# Patient Record
Sex: Male | Born: 1990 | Race: Black or African American | Hispanic: No | Marital: Single | Smoking: Current every day smoker
Health system: Southern US, Community
[De-identification: ages and names within clinical notes are randomized; demographics above are authoritative.]

---
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
## 2011-04-21 NOTE — Op Note (Signed)
NAMEMAXMILLIAN, Michael Sanford                 ACCOUNT NO.:  [id number]   MEDICAL RECORD NO.:  [id number]          PATIENT TYPE:  AMB   LOCATION:  DSC                          FACILITY:  [facility]   PHYSICIAN:  [name]. Dalldorf, M.D.DATE OF BIRTH:  08/02/1991   DATE OF PROCEDURE:  11/01/2007  DATE OF DISCHARGE:                               OPERATIVE REPORT   PREOPERATIVE DIAGNOSIS:  Left hip retained hardware.   POSTOPERATIVE DIAGNOSIS:  Left hip retained hardware.   PROCEDURE:  Left hip removal of hardware.   ANESTHESIA:  General.   ATTENDING SURGEON:  [name]. Jerl Santos, M.D.   ASSISTANT:  Lindwood Qua, P.A.   INDICATIONS FOR PROCEDURE:  The patient is a 20 year old boy less than a  year from SCFE pinning.  His growth plate has gone on to close and his  pain has been eliminated.  He is offered hardware removal of this point.  Informed operative consent was obtained after discussion of possible  complications of reaction to anesthesia and infection.   SUMMARY OF FINDINGS AND PROCEDURE:  Under general anesthesia through a  slightly extended incision, a single Ace cannulated pediatric hip screw  was removed.  I used fluoroscopy throughout the case to make appropriate  intraoperative decisions.  I read all these views myself.   DESCRIPTION OF PROCEDURE:  The patient was taken to an operating suite  where general anesthetic was applied without difficulty.  He is  positioned in the lateral decubitus position on a beanbag and axillary  roll was placed.  He was prepped and draped in normal sterile fashion.  After administration of preop IV Kefzol, his old incision was made with  dissection down to the hip screw.  Unfortunately, there was some bony  overgrowth and we could not remove this percutaneously.  As a result, I  had to extend the incision proximally about an inch and dissected down  in an open fashion to expose the screw.  I used the chisel to remove  some surrounding bone and placed  the appropriate T-handle guide over the  screw and removed it without incident.  Fluoroscopy was used to firm  adequate removal of the hardware and also to confirm that his growth  plate had closed.  The wound was irrigated followed by reapproximation  of deep tissues with Vicryl and skin with nylon.  Adaptic was applied  followed by dry gauze and tape.  Estimated blood loss and intraoperative  fluids can be obtained from anesthesia records.   DISPOSITION:  The patient was extubated in the operating room and taken  to recovery room in stable addition.  He was to go home the same day and  follow up in the office less than a week.  We gave him his screws  sterilized at the end of the case.  I will contact him by phone tonight.      [name] Jerl Santos, M.D.  Electronically Signed     PGD/MEDQ  D:  11/01/2007  T:  11/01/2007  Job:  [id number]

## 2011-05-06 ENCOUNTER — Emergency Department (HOSPITAL_COMMUNITY)
Admission: EM | Admit: 2011-05-06 | Discharge: 2011-05-06 | Disposition: A | Discharge status: Home / Self Care | Payer: Medicaid Other | Attending: Emergency Medicine | Admitting: Emergency Medicine

## 2011-05-06 DIAGNOSIS — Z202 Contact with and (suspected) exposure to infections with a predominantly sexual mode of transmission: Secondary | ICD-10-CM | POA: Insufficient documentation

## 2011-05-06 DIAGNOSIS — N342 Other urethritis: Secondary | ICD-10-CM | POA: Insufficient documentation

## 2011-05-06 LAB — URINALYSIS, ROUTINE W REFLEX MICROSCOPIC
Bilirubin Urine: NEGATIVE
Glucose, UA: NEGATIVE mg/dL
Ketones, ur: NEGATIVE mg/dL
Protein, ur: NEGATIVE mg/dL
Specific Gravity, Urine: 1.023 (ref 1.005–1.030)
pH: 5 (ref 5.0–8.0)

## 2011-05-06 LAB — URINE MICROSCOPIC-ADD ON

## 2011-05-09 ENCOUNTER — Emergency Department (HOSPITAL_COMMUNITY)
Admission: EM | Admit: 2011-05-09 | Discharge: 2011-05-09 | Discharge status: Against Medical Advice | Payer: Medicaid Other | Attending: Emergency Medicine | Admitting: Emergency Medicine

## 2011-05-09 ENCOUNTER — Emergency Department (HOSPITAL_COMMUNITY): Payer: Medicaid Other

## 2011-05-09 DIAGNOSIS — S20219A Contusion of unspecified front wall of thorax, initial encounter: Secondary | ICD-10-CM | POA: Insufficient documentation

## 2011-05-09 LAB — GC/CHLAMYDIA PROBE AMP, GENITAL
Chlamydia, DNA Probe: POSITIVE — AB
GC Probe Amp, Genital: NEGATIVE

## 2011-08-22 ENCOUNTER — Emergency Department (HOSPITAL_COMMUNITY)
Admission: EM | Admit: 2011-08-22 | Discharge: 2011-08-22 | Disposition: A | Discharge status: Home / Self Care | Payer: Medicaid Other | Attending: Emergency Medicine | Admitting: Emergency Medicine

## 2011-08-22 ENCOUNTER — Emergency Department (HOSPITAL_COMMUNITY): Payer: Medicaid Other

## 2011-08-22 DIAGNOSIS — M25473 Effusion, unspecified ankle: Secondary | ICD-10-CM | POA: Insufficient documentation

## 2011-08-22 DIAGNOSIS — R300 Dysuria: Secondary | ICD-10-CM | POA: Insufficient documentation

## 2011-08-22 DIAGNOSIS — M25476 Effusion, unspecified foot: Secondary | ICD-10-CM | POA: Insufficient documentation

## 2011-08-22 DIAGNOSIS — X500XXA Overexertion from strenuous movement or load, initial encounter: Secondary | ICD-10-CM | POA: Insufficient documentation

## 2011-08-22 DIAGNOSIS — S93409A Sprain of unspecified ligament of unspecified ankle, initial encounter: Secondary | ICD-10-CM | POA: Insufficient documentation

## 2011-08-22 DIAGNOSIS — Y9367 Activity, basketball: Secondary | ICD-10-CM | POA: Insufficient documentation

## 2011-08-22 DIAGNOSIS — M25579 Pain in unspecified ankle and joints of unspecified foot: Secondary | ICD-10-CM | POA: Insufficient documentation

## 2011-08-25 LAB — GC/CHLAMYDIA PROBE AMP, GENITAL: GC Probe Amp, Genital: NEGATIVE

## 2011-09-15 LAB — POCT HEMOGLOBIN-HEMACUE: Operator id: 116011

## 2013-03-05 ENCOUNTER — Emergency Department (HOSPITAL_COMMUNITY)
Admission: EM | Admit: 2013-03-05 | Discharge: 2013-03-05 | Discharge status: Absent without leave | Payer: Self-pay | Source: Home / Self Care

## 2014-12-13 ENCOUNTER — Emergency Department (HOSPITAL_COMMUNITY)
Admission: EM | Admit: 2014-12-13 | Discharge: 2014-12-13 | Disposition: A | Discharge status: Home / Self Care | Payer: Self-pay | Attending: Emergency Medicine | Admitting: Emergency Medicine

## 2014-12-13 ENCOUNTER — Encounter (HOSPITAL_COMMUNITY): Payer: Self-pay | Admitting: Emergency Medicine

## 2014-12-13 DIAGNOSIS — W109XXA Fall (on) (from) unspecified stairs and steps, initial encounter: Secondary | ICD-10-CM | POA: Insufficient documentation

## 2014-12-13 DIAGNOSIS — Y9389 Activity, other specified: Secondary | ICD-10-CM | POA: Insufficient documentation

## 2014-12-13 DIAGNOSIS — Y998 Other external cause status: Secondary | ICD-10-CM | POA: Insufficient documentation

## 2014-12-13 DIAGNOSIS — Z720 Tobacco use: Secondary | ICD-10-CM | POA: Insufficient documentation

## 2014-12-13 DIAGNOSIS — Y9289 Other specified places as the place of occurrence of the external cause: Secondary | ICD-10-CM | POA: Insufficient documentation

## 2014-12-13 DIAGNOSIS — S8992XA Unspecified injury of left lower leg, initial encounter: Secondary | ICD-10-CM | POA: Insufficient documentation

## 2014-12-13 MED ORDER — IBUPROFEN 800 MG PO TABS: 800.0000 mg | ORAL_TABLET | Freq: Three times a day (TID) | ORAL | Status: AC | PRN

## 2014-12-13 NOTE — ED Notes (Signed)
Pt comfortable with discharge and follow up instructions. Pt declines wheelchair, escorted to waiting area by this RN. Prescriptions x1. 

## 2014-12-13 NOTE — ED Notes (Signed)
Pt ambulated to room without difficulty

## 2014-12-13 NOTE — Discharge Instructions (Signed)
Read the information below.  Use the prescribed medication as directed.  Please discuss all new medications with your pharmacist.  You may return to the Emergency Department at any time for worsening condition or any new symptoms that concern you.  If you develop uncontrolled pain, weakness or numbness of the extremity, severe discoloration of the skin, or you are unable to walk or move your knee, return to the ER for a recheck.       Emergency Department Resource Guide 1) Find a Doctor and Pay Out of Pocket Although you won't have to find out who is covered by your insurance plan, it is a good idea to ask around and get recommendations. You will then need to call the office and see if the doctor you have chosen will accept you as a new patient and what types of options they offer for patients who are self-pay. Some doctors offer discounts or will set up payment plans for their patients who do not have insurance, but you will need to ask so you aren't surprised when you get to your appointment.  2) Contact Your Local Health Department Not all health departments have doctors that can see patients for sick visits, but many do, so it is worth a call to see if yours does. If you don't know where your local health department is, you can check in your phone book. The CDC also has a tool to help you locate your state's health department, and many state websites also have listings of all of their local health departments.  3) Find a Walk-in Clinic If your illness is not likely to be very severe or complicated, you may want to try a walk in clinic. These are popping up all over the country in pharmacies, drugstores, and shopping centers. They're usually staffed by nurse practitioners or physician assistants that have been trained to treat common illnesses and complaints. They're usually fairly quick and inexpensive. However, if you have serious medical issues or chronic medical problems, these are probably not  your best option.  No Primary Care Doctor: - Call Health Connect at  [phone] - they can help you locate a primary care doctor that  accepts your insurance, provides certain services, etc. - Physician Referral Service- [phone]  Chronic Pain Problems: Organization         Address  Phone   Notes  [facility]  [phone] Patients need to be referred by their primary care doctor.   Medication Assistance: Organization         Address  Phone   Notes  [facility] Medication [facility] [address] [place], [place] [address] [phone] --Must be a resident of [facility] [place] -- Must have NO insurance coverage whatsoever (no Medicaid/ Medicare, etc.) -- The pt. MUST have a primary care doctor that directs their care regularly and follows them in the community   MedAssist  [phone]   [employer]  [phone]    Agencies that provide inexpensive medical care: Organization         Address  Phone   Notes  [facility]  [phone]   [facility]    [phone]   [facility] [address] [place], [place] [address] [phone]   [facility] [place] [address], [place] [phone]   [facility]anned [facility]    [phone]   [facility]    [phone]   [address], [place] Phone:  [phone], Fax:  [phone] Hours of Operation:  9 am - 6 pm, M-F.  Also accepts Medicaid/Medicare and self-pay.  [facility] for Kaka [address], [address], [place] Phone: [phone], Fax: [phone]. Hours of Operation:  8:30 am - 5:30 pm, M-F.  Also accepts Medicaid and self-pay.  [facility] [place] [address], [place] Phone: [phone]   [place], [place], [place] [phone], Ext. [phone] Mondays & Thursdays: 7-9 AM.  First  15 patients are seen on a first come, first serve basis.    [place] Providers:  Organization         Address  Phone   Notes  [facility] [address], [place] [phone] Also accepts self-pay patients.  [facility] [id number] [place], [place]  [phone]   [place], [address], [place] [phone]   [facility] [address], [place] [phone]   Lucianne Lei [address], [place]   [phone] Only accepts [place] Access [place] patients after they have their name applied to their card.   Self-Pay (no insurance) in [facility]):  Organization         Address  Phone   Notes  Sickle Cell Patients, [facility] [place] [phone]   [facility] [place] [phone]   [facility] [place]  [place], [place], [place] [phone]   [facility]/Dr. Osei-Bonsu  [address], [place] or [place] city  Dr, [address], [place] [phone] Phone number for both [place] and [place] locations is the same.  Urgent Medical and [facility] [address] [place] [phone]   [facility] [address] [place] or [address] [phone]   [facility] [address] [place] [phone], phone; [phone], fax Sees patients 1st and 3rd Saturday of every month.  Must not qualify for public or private insurance (i.e. Medicaid, Medicare, [place] Health Choice, Veterans' Benefits)  Household income should be no more than 200% of the poverty level The clinic cannot treat you if you are pregnant or think you are pregnant  Sexually transmitted diseases are not treated at the clinic.    Dental  Care: Organization         Address  Phone  Notes  [facility] [place] [phone] Accepts children up to age 50 who are enrolled in [place] or [place]; pregnant women with a Medicaid card; and children who have applied for Medicaid or [place] Health Choice, but were declined, whose parents can pay a reduced fee at time of service.  [facility] Department of [facility]  [address], [place] [phone] Accepts children up to age 74 who are enrolled in [place] or [place]; pregnant women with a Medicaid card; and children who have applied for Medicaid or [place] Health Choice, but were declined, whose parents can pay a reduced fee at time of service.  [place]  Access PROGRAM  [place] [phone] Patients are seen by appointment only. Walk-ins are not accepted. [place] will see patients 53 years of age and older. Monday - Tuesday (8am-5pm) Most Wednesdays (8:30-5pm) $30 per visit, cash only  [facility] Access PROGRAM  [address], [facility] [phone] Patients are seen by appointment only. Walk-ins are not accepted. [place] will see patients 54 years of age and older. One Wednesday Evening (Monthly: Volunteer Based).  $30 per visit, cash only  [place]  [phone] for adults; Children under age 34, call Graduate Pediatric Dentistry at [phone]. Children aged 25-14, please call [phone] to request a pediatric application.  Dental services are provided in all areas of dental care including fillings, crowns and bridges, complete and partial dentures, implants, gum treatment, root canals, and extractions. Preventive care is also provided. Treatment is provided to both adults and children. Patients are selected via a lottery and there is often a waiting list.   [facility] [address] [place]  [phone] [email or web address]   [facility] [address] [place], [place] [phone], Ext. [phone] Second and Fourth Thursday of each month, opens at 6:30 AM; Clinic ends at 9 AM.  Patients are seen on a first-come first-served basis, and a limited number are seen during each clinic.   [facility]  [address] Hillard Danker [place], [place] [phone]   Eligibility Requirements You must have lived in [place], [place], or [place] counties for at least the last three months.   You cannot be eligible for state or federal sponsored [employer], including [employer], [place], or [employer].   You generally cannot be eligible for healthcare insurance through your employer.    How to apply: Eligibility screenings are held every Tuesday and Wednesday afternoon from 1:00 pm until 4:00 pm. You do not need an appointment for the interview!  [facility] [address], [place], [place]   [place]  [place] Department  [place]  [phone]    Behavioral Health Resources in the Community: Intensive Outpatient Programs Organization         Address  Phone  Notes  [facility] [place]. [address], [place], [place] [phone]   [facility] Outpatient [address] [place], [place]   ADS: Alcohol & Drug Svcs [address] [place], [place]   [place] [address],  [place], [place] or [phone]   Substance Abuse Resources Organization         Address  Phone  Notes  Alcohol and Drug Services  [phone]   [place]  [phone]   [facility] [place]   Chinita Pester  [phone]   Residential & Outpatient Substance Abuse Program  [phone]    Psychological Services Organization         Address  Phone  Notes  [facility] [place]  [place]  [phone]   [place] [address] [place] or [phone]    Mobile Crisis Teams Organization         Address  Phone  Notes  [facility], Mobile Crisis Care Unit  [phone]   [facility]  [address] [place],  [place] [address] [place] [phone]    Self-Help/Support Groups Organization         Address  Phone             Notes  [facility] of [place] - variety of support groups  [place] Call for more information  Narcotics Anonymous (NA), [facility] [address], [employer] [place]  2 meetings at this location   Special educational needs teacher         Address  Phone  Notes  [facility] Residential Treatment [place],    [place]  [phone]   [facility]  [address], [place] [address], [place], [place]   [place], Taniaya Rudder [place] [phone] Admissions: 8am-3pm M-F  Incentives Substance [place] [address],    [place], [place] [phone]   [facility] [address] [place], [place], [place]   [facility] [address]  [place], [place]   [facility] Programs - Intensive Outpatient [address] Kristeen [address], [place], [place]   [facility] ([place]min [place] [place].,  [place], [place] [phone] or [phone]   [facility] Services (RTS) [address] [place], [place] Accepts Medicaid  [facility] [place] [address],  [place] [phone] Substance Abuse/Addiction Treatment   [facility] Organization         Address  Phone  Notes  [facility]  [phone]   Domenic Schwab, PhD [address] Arlis Porta [place], [place]   [phone] or [phone]   [place], [place] [phone]   [facility] [address] [place], [place] [phone] Insurance/Medicaid/sponsorship through [facility] and Families [address], [address] [place]                                    [place], [place] [phone] [place] [address] Street[place], [place] [phone]    Dr. Adele Schilder  [phone]   [facility] Dept. 1) [address] [place] 2) [facility] 3)  [place] [address], [place] [phone]  [phone]   [place] [phone] or [phone] (After Hours)

## 2014-12-13 NOTE — ED Notes (Signed)
Pt reports he fell and had right knee swelling and cannot bend it. Swelling has gone down according to pt. Ambulates on it.

## 2014-12-13 NOTE — ED Provider Notes (Signed)
CSN: [id number]     Arrival date & time 12/13/14  1355 History   First MD Initiated Contact with Patient 12/13/14 1704     This chart was scribed for non-physician practitioner, Trixie DredgeEmily Magnus Crescenzo PA-C  working with Raeford RazorStephen Kohut, MD by Arlan OrganAshley Leger, ED Scribe. This patient was seen in room TR11C/TR11C and the patient's care was started at 5:41 PM.   Chief Complaint  Patient presents with  . Knee Pain   The history is provided by the patient. No language interpreter was used.    HPI Comments: Michael Sanford is a 24 y.o. male who presents to the Emergency Department complaining of constant, moderate L knee pain with associated swelling x 4-5 days. Pain is described as sharp and rated 8/10 with movement. Discomfort is exacerbated at night time. Pt attributes pain to falling down 4-5 concrete steps after tripping. Pt states he is unable to bend his knee secondary to pain. He has not tried any OTC medications or home remedies to help manage symptoms. No fever, chills, nausea, vomiting, abdominal pain, SOB, or CP. He denies any weakness, numbness, or paresthesia. No known allergies to medications.   History reviewed. No pertinent past medical history. History reviewed. No pertinent past surgical history. History reviewed. No pertinent family history. History  Substance Use Topics  . Smoking status: Current Every Day Smoker -- 0.50 packs/day  . Smokeless tobacco: Not on file  . Alcohol Use: Not on file    Review of Systems  Constitutional: Negative for fever and chills.  Musculoskeletal: Positive for joint swelling and arthralgias.  Skin: Negative for color change and wound.  Allergic/Immunologic: Negative for immunocompromised state.  Neurological: Negative for weakness and numbness.  Hematological: Does not bruise/bleed easily.      Allergies  Review of patient's allergies indicates no known allergies.  Home Medications   Prior to Admission medications   Not on File   Triage Vitals: BP  123/68 mmHg  Pulse 84  Temp(Src) 97.1 F (36.2 C) (Oral)  Resp 18  SpO2 100%  Physical Exam  Constitutional: He appears well-developed and well-nourished. No distress.  HENT:  Head: Normocephalic and atraumatic.  Neck: Neck supple.  Cardiovascular: Intact distal pulses.   Pulmonary/Chest: Effort normal.  Musculoskeletal: Normal range of motion.  Able to flex L knee to 90 degrees Able to extend L leg completely L knee stable No laxity of joint No pain with valgus or vagus stressing Mild tenderness medially to L knee  Neurological: He is alert.  Skin: No rash noted. He is not diaphoretic. No erythema.  Nursing note and vitals reviewed.   ED Course  Procedures (including critical care time)  DIAGNOSTIC STUDIES: Oxygen Saturation is 100% on RA, Normal by my interpretation.    COORDINATION OF CARE: 5:40 PM-Offered knee sleeve to pt but he declined. Discussed treatment plan with pt at bedside and pt agreed to plan.     Labs Review Labs Reviewed - No data to display  Imaging Review No results found.   EKG Interpretation None      MDM   Final diagnoses:  Left knee injury, initial encounter    Afebrile, nontoxic patient with left knee pain after trip and fall several days ago.  Knee improving without treatment.  Neurovascularly intact.  NO focal bony tenderness.  Doubt fracture.  Joint stable.  Pt declined knee sleeve and crutches.   D/C home with ibuprofen, RICE recommendations.  Discussed result, findings, treatment, and follow up  with patient.  Pt given return precautions.  Pt verbalizes understanding and agrees with plan.       I personally performed the services described in this documentation, which was scribed in my presence. The recorded information has been reviewed and is accurate.    Trixie Dredge, PA-C 12/13/14 2126  Raeford Razor, MD 12/14/14 [phone]

## 2024-01-12 ENCOUNTER — Ambulatory Visit (INDEPENDENT_AMBULATORY_CARE_PROVIDER_SITE_OTHER): Payer: Medicaid Other | Admitting: Podiatry

## 2024-01-12 DIAGNOSIS — L600 Ingrowing nail: Secondary | ICD-10-CM

## 2024-01-12 NOTE — Progress Notes (Signed)
  Subjective:  Patient ID: Michael Sanford Brought, male    DOB: 05-Mar-1991,  MRN: [id number]  Chief Complaint  Patient presents with   Nail Problem    RM#13 Bilateral big toenails lifting possible nail fungus ongoing for years and getting worse.    33 y.o. male presents with the above complaint.  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  He would like to make it permanent.  It is not clinically infected   Review of Systems: Negative except as noted in the HPI. Denies N/V/F/Ch.  No past medical history on file.  Current Outpatient Medications:    ibuprofen  (ADVIL ,MOTRIN ) 800 MG tablet, Take 1 tablet (800 mg total) by mouth every 8 (eight) hours as needed for mild pain or moderate pain., Disp: 15 tablet, Rfl: 0  Social History   Tobacco Use  Smoking Status Every Day   Current packs/day: 0.50   Types: Cigarettes  Smokeless Tobacco Not on file    No Known Allergies Objective:  There were no vitals filed for this visit. There is no height or weight on file to calculate BMI. Constitutional Well developed. Well nourished.  Vascular Dorsalis pedis pulses palpable bilaterally. Posterior tibial pulses palpable bilaterally. Capillary refill normal to all digits.  No cyanosis or clubbing noted. Pedal hair growth normal.  Neurologic Normal speech. Oriented to person, place, and time. Epicritic sensation to light touch grossly present bilaterally.  Dermatologic Pain on palpation of the entire/total nail on 1st digit of the right No other open wounds. No skin lesions.  Orthopedic: Normal joint ROM without pain or crepitus bilaterally. No visible deformities. No bony tenderness.   Radiographs: None Assessment:   1. Ingrown toenail of right foot    Plan:   Patient was evaluated and treated and all questions answered.  Nail contusion/dystrophy hallux with underlying ingrown, right -Patient elects to proceed with minor surgery to remove entire toenail today. Consent reviewed and signed by patient. -Entire/total nail excised. See procedure note. -Educated on post-procedure care including soaking. Written instructions provided and reviewed. -Patient to follow up in 2 weeks for nail check.  Procedure: Excision of entire/total nail  Location: Right 1st toe digit Anesthesia: Lidocaine 1% plain; 1.5 mL and Marcaine 0.5% plain; 1.5 mL, digital block. Skin Prep: Betadine. Dressing: Silvadene; telfa; dry, sterile, compression dressing. Technique: Following skin prep, the toe was exsanguinated and a tourniquet was secured at the base of the toe. The affected nail border was freed and excised.  Phenol matricectomy was performed in standard technique.  No complication noted the tourniquet was then removed and sterile dressing applied. Disposition: Patient tolerated procedure well. Patient to return in 2 weeks for follow-up.   No follow-ups on file.

## 2024-01-12 NOTE — Patient Instructions (Signed)

## 2024-01-19 ENCOUNTER — Ambulatory Visit (HOSPITAL_COMMUNITY)
Admission: EM | Admit: 2024-01-19 | Discharge: 2024-01-19 | Discharge status: Against Medical Advice | Payer: Medicaid Other

## 2024-01-19 NOTE — Progress Notes (Addendum)
   01/19/24 1127  [facility] Triage Screening (Walk-ins at [facility] only)  How Did You Hear About [place]? Self  What Is the Reason for Your Visit/Call Today? Pt presents to [facility] voluntarily unaccompanied. Pt states that PO told him to come in for [facility] evaluation. Pt denies SI, HI, AVH and alcohol/drug use. Pt is trying to get off parole by completing some required tasks.  How Long Has This Been Causing You Problems? 1 wk - 1 month  Have You Recently Had Any Thoughts About Hurting Yourself? No  Are You Planning to Commit Suicide/Harm Yourself At This time? No  Have you Recently Had Thoughts About Hurting Someone [name]? No  Are You Planning To Harm Someone At This Time? No  Physical Abuse Denies  Verbal Abuse Denies  Sexual Abuse Denies  Exploitation of patient/patient's resources Denies  Self-Neglect Denies  Are you currently experiencing any auditory, visual or other hallucinations? No  Have You Used Any Alcohol or Drugs in the Past 24 Hours? No  Do you have any current medical co-morbidities that require immediate attention? No  Clinician description of patient physical appearance/behavior: Calm, Cooperative, Neatly dressed  What Do You Feel Would Help You the Most Today?  (Evaluation)  If access to [facility] Urgent Care was not available, would you have sought care in the Emergency Department? No  Determination of Need Routine (7 days)  Options For Referral Other: Comment (Evaluation)

## 2024-01-20 ENCOUNTER — Ambulatory Visit (HOSPITAL_COMMUNITY)
Admission: EM | Admit: 2024-01-20 | Discharge: 2024-01-20 | Disposition: A | Discharge status: Home / Self Care | Payer: Medicaid Other | Attending: Psychiatry | Admitting: Psychiatry

## 2024-01-20 DIAGNOSIS — Z653 Problems related to other legal circumstances: Secondary | ICD-10-CM | POA: Insufficient documentation

## 2024-01-20 DIAGNOSIS — F411 Generalized anxiety disorder: Secondary | ICD-10-CM

## 2024-01-20 DIAGNOSIS — Z008 Encounter for other general examination: Secondary | ICD-10-CM | POA: Insufficient documentation

## 2024-01-20 DIAGNOSIS — F419 Anxiety disorder, unspecified: Secondary | ICD-10-CM | POA: Insufficient documentation

## 2024-01-20 NOTE — Discharge Instructions (Addendum)
Adult Mental Health Resources  [facility] Address: [address], [place], [place] [address] Phone: [phone]  [facility], [facility] Address: [address], [place], [place] [address] Hours:  Closed ? Opens 8?AM Mon Phone: [phone]  [facility] Address: [address], [place], [place] [address] Phone: [phone]  [facility] Address: [address], [place], [place] [address] Phone: [phone]  [facility] Address: [facility], [address], [place], [place] [address] Hours:  Closed ? Opens 8?AM Mon Phone: [phone]  [facility] Address: [address] [place], [place], [place] [address] Phone: [phone] INTAKE: [phone] Ext [phone] Counsel[facility] Address: [address] [place], [place] [address] Phone: [phone]  [facility] Address: [address], [place], [place] [address] Phone: [phone]  [facility] Address: [address], [place], [place] [address] Phone: [phone]  [facility] Address: [address] [place], [place] [address] Phone: [phone]  [facility] Phone: [phone]   [facility]  Address: [address] [place], [place] [address] Hours: Open 24 hours Phone: [phone]

## 2024-01-20 NOTE — ED Provider Notes (Signed)
Behavioral Health Urgent Care Medical Screening Exam  Patient Name: Michael Sanford MRN: [id number] Date of Evaluation: 01/20/24 Chief Complaint:   Diagnosis:  Final diagnoses:  None    History of Present illness: Michael Sanford is a 33 year old male with a self-reported psychiatric history of anxiety presenting to [facility] voluntarily seeking a mental health evaluation. Patient was released from incarceration on parole in November 2024 and part of his parole requirements is to get a mental health evaluation. Patient denies SI, HI, AVH and SIB. Patient does not have outpatient services and denies history of psychiatric hospitalization.  He is psychiatrically clear and can continue to complete his parole services at this time.  Assessment Note: On assessment today, the pt reports that his mood is not depressed and affect is congruent.  He is alert, pleasant, cooperative, and oriented to person, place, time, and situation Reports that anxiety is at manageable level Sleep is good Appetite is great Concentration is good Energy level is adequate Denies suicidal thoughts.  Denies suicidal intent and plan.  Denies having any HI.  Denies having psychotic symptoms.   Flowsheet Row ED from 01/20/2024 in [facility] ED from 01/19/2024 in [facility]  C-SSRS RISK CATEGORY No Risk No Risk      Psychiatric Specialty Exam  Presentation  General Appearance:Appropriate for Environment; Casual; Fairly Groomed  Eye Contact:Good  Speech:Clear and Coherent  Speech Volume:Normal  Handedness:Right  Mood and Affect  Mood: Euthymic  Affect: Appropriate; Congruent  Thought Process  Thought Processes: Coherent; Goal Directed  Descriptions of Associations:Intact  Orientation:Full (Time, Place and Person)  Thought Content:Logical; WDL    Hallucinations:None  Ideas of Reference:None  Suicidal Thoughts:No  Homicidal  Thoughts:No  Sensorium  Memory: Immediate Good; Recent Good  Judgment: Good  Insight: Good  Executive Functions  Concentration: Good  Attention Span: Good  Recall: Good  Fund of Knowledge: Good  Language: Good  Psychomotor Activity  Psychomotor Activity: Normal  Assets  Assets: Communication Skills; Desire for Improvement; Physical Health; Resilience; Social Support  Sleep  Sleep: Good  Number of hours:  6  Physical Exam: Physical Exam Vitals and nursing note reviewed.  Constitutional:      General: He is not in acute distress.    Appearance: He is not ill-appearing or toxic-appearing.  HENT:     Head: Normocephalic.     Nose: Nose normal.     Mouth/Throat:     Mouth: Mucous membranes are moist.     Pharynx: Oropharynx is clear.  Eyes:     Extraocular Movements: Extraocular movements intact.  Cardiovascular:     Rate and Rhythm: Tachycardia present.     Comments: Blood pressure 122/71, pulse rate 103.  Patient remains asymptomatic. Pulmonary:     Effort: Pulmonary effort is normal.  Abdominal:     Comments: Deferred  Genitourinary:    Comments: Deferred Musculoskeletal:        General: Normal range of motion.     Cervical back: Normal range of motion.  Skin:    General: Skin is warm.  Neurological:     General: No focal deficit present.     Mental Status: He is alert and oriented to person, place, and time.  Psychiatric:        Mood and Affect: Mood normal.        Behavior: Behavior normal.        Thought Content: Thought content normal.        Judgment:  Judgment normal.    Review of Systems  Constitutional:  Negative for chills and fever.  HENT:  Negative for sore throat.   Eyes:  Negative for blurred vision.  Respiratory:  Negative for cough, sputum production, shortness of breath and wheezing.   Cardiovascular:  Negative for chest pain and palpitations.  Gastrointestinal:  Negative for abdominal pain, heartburn, nausea and  vomiting.  Genitourinary: Negative.  Negative for dysuria.  Musculoskeletal: Negative.   Skin:  Negative for itching and rash.  Neurological:  Negative for dizziness, tingling, tremors, sensory change, speech change, focal weakness, seizures, loss of consciousness, weakness and headaches.  Endo/Heme/Allergies:        See allergy listing  Psychiatric/Behavioral:  Negative for depression, hallucinations, substance abuse and suicidal ideas. The patient is not nervous/anxious.    Blood pressure 122/71, pulse (!) 103, temperature 97.7 F (36.5 C), temperature source Oral, resp. rate 19. There is no height or weight on file to calculate BMI.  Musculoskeletal: Strength & Muscle Tone: within normal limits Gait & Station: normal Patient leans: N/A  [facility] MSE Discharge Disposition for Follow up and Recommendations: Based on my evaluation the patient does not appear to have an emergency medical condition and can be discharged with resources and follow up care in outpatient services if needed.  Cecilie Lowers, FNP 01/20/2024, 2:42 PM

## 2024-01-20 NOTE — ED Notes (Signed)
Pt discharged with AVS. Pt alert, oriented, and ambulatory. Escorted by MHT. Safety maintained.

## 2024-01-20 NOTE — Progress Notes (Signed)
   01/20/24 1244  [facility] Triage Screening (Walk-ins at [facility] only)  How Did You Hear About [place]? Other (Comment) Physiological scientist)  What Is the Reason for Your Visit/Call Today? Michael Sanford is a 33 year old male with a self-reported psychiatric history of anxiety presenting to [facility] voluntarily seeking a mental health evaluation. Patient was released from incarceration on parole in November 2024 and part of his parole requirements is to get a mental health evaluation. Patient denies SI, HI, AVH and SIB. Patient does not have outpatient services and denies history of psychiatric hospitalization.  TTS informed patient that today he will only receive a crisis assessment and AVS and if he was looking for a full mental health evaluation he would have to go to another facility. Patient understands and agrees with staying for crisis assessment.  How Long Has This Been Causing You Problems? 1-6 months  Have You Recently Had Any Thoughts About Hurting Yourself? No  Are You Planning to Commit Suicide/Harm Yourself At This time? No  Have you Recently Had Thoughts About Hurting Someone Michael Sanford? No  Are You Planning To Harm Someone At This Time? No  Physical Abuse Denies  Verbal Abuse Denies  Sexual Abuse Denies  Exploitation of patient/patient's resources Denies  Self-Neglect Denies  Are you currently experiencing any auditory, visual or other hallucinations? No  Have You Used Any Alcohol or Drugs in the Past 24 Hours? No  Do you have any current medical co-morbidities that require immediate attention? No  Clinician description of patient physical appearance/behavior: calm, engaging, coopertive  What Do You Feel Would Help You the Most Today?  (MH Evaluation)  If access to [facility] Urgent Care was not available, would you have sought care in the Emergency Department? No  Determination of Need Routine (7 days)  Options For Referral Outpatient Therapy
